# Patient Record
Sex: Female | Born: 2000 | Race: White | Hispanic: No | Marital: Single | Smoking: Never smoker
Health system: Southern US, Community
[De-identification: ages and names within clinical notes are randomized; demographics above are authoritative.]

## PROBLEM LIST (undated history)

## (undated) DIAGNOSIS — F419 Anxiety disorder, unspecified: Secondary | ICD-10-CM

## (undated) HISTORY — PX: TYMPANOSTOMY TUBE PLACEMENT: SHX32

---
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
## 2022-04-16 ENCOUNTER — Emergency Department (HOSPITAL_BASED_OUTPATIENT_CLINIC_OR_DEPARTMENT_OTHER)
Admission: EM | Admit: 2022-04-16 | Discharge: 2022-04-16 | Disposition: A | Discharge status: Home / Self Care | Payer: 59 | Attending: Emergency Medicine | Admitting: Emergency Medicine

## 2022-04-16 ENCOUNTER — Other Ambulatory Visit: Payer: Self-pay

## 2022-04-16 ENCOUNTER — Encounter (HOSPITAL_BASED_OUTPATIENT_CLINIC_OR_DEPARTMENT_OTHER): Payer: Self-pay | Admitting: Emergency Medicine

## 2022-04-16 DIAGNOSIS — Z20822 Contact with and (suspected) exposure to covid-19: Secondary | ICD-10-CM | POA: Diagnosis not present

## 2022-04-16 DIAGNOSIS — R112 Nausea with vomiting, unspecified: Secondary | ICD-10-CM | POA: Diagnosis not present

## 2022-04-16 DIAGNOSIS — J069 Acute upper respiratory infection, unspecified: Secondary | ICD-10-CM | POA: Diagnosis not present

## 2022-04-16 DIAGNOSIS — R197 Diarrhea, unspecified: Secondary | ICD-10-CM | POA: Diagnosis not present

## 2022-04-16 DIAGNOSIS — R059 Cough, unspecified: Secondary | ICD-10-CM | POA: Diagnosis present

## 2022-04-16 LAB — RESP PANEL BY RT-PCR (RSV, FLU A&B, COVID)  RVPGX2
Influenza A by PCR: NEGATIVE
Influenza B by PCR: NEGATIVE
Resp Syncytial Virus by PCR: NEGATIVE
SARS Coronavirus 2 by RT PCR: NEGATIVE

## 2022-04-16 LAB — PREGNANCY, URINE: Preg Test, Ur: NEGATIVE

## 2022-04-16 MED ORDER — ONDANSETRON 4 MG PO TBDP: 4.0000 mg | ORAL_TABLET | Freq: Three times a day (TID) | ORAL | 0 refills | Status: DC | PRN

## 2022-04-16 MED ORDER — ONDANSETRON 4 MG PO TBDP
8.0000 mg | ORAL_TABLET | Freq: Once | ORAL | Status: AC
  Administered 2022-04-16: 8 mg via ORAL
  Filled 2022-04-16: qty 2

## 2022-04-16 NOTE — ED Provider Notes (Signed)
[facility] EM[facility] DEPT Provider Note   CSN: [id number] Arrival date & time: 04/16/22  1051     History  Chief Complaint  Patient presents with   Generalized Body Aches   Emesis    Lisa Harvey is a 22 y.o. female.  Lisa Harvey is a 22 year old female with no significant PMHx presents to the ED for body aches, chills, vomiting, and diarrhea for 3 days. She states that she checked her temperature yesterday at home and it was 95.37F. She also reports that she has been unable to keep food down or liquids during this time. Patient states that she took Tylenol yesterday but shortly after she vomited. She denies having any exposure to sick contacts. Patient denies the possibility of being pregnant as she is currently on her menstrual cycle. Patient denies chest pain, shortness of breath, dysuria, or abdominal pain.        Home Medications Prior to Admission medications   Medication Sig Start Date End Date Taking? Authorizing Provider  ondansetron (ZOFRAN-ODT) 4 MG disintegrating tablet Take 1 tablet (4 mg total) by mouth every 8 (eight) hours as needed for nausea or vomiting. 04/16/22  Yes Tacy Learn, PA-C  azithromycin (ZITHROMAX) 250 MG tablet Take 2 tabs PO x 1 dose, then 1 tab PO QD x 4 days 12/27/20   Scot Jun, FNP  citalopram (CELEXA) 10 MG tablet Take 1 tablet (10 mg total) by mouth at bedtime. 09/03/15   Saez-Benito, Roxy Manns, MD  diphenhydrAMINE (BENYLIN) 12.5 MG/5ML syrup Take 15 mLs (37.5 mg total) by mouth every 6 (six) hours as needed for itching or allergies. 12/27/16 12/30/16  Cruz, Lia C, DO  famotidine (PEPCID) 20 MG tablet Take 1 tablet (20 mg total) by mouth 2 (two) times daily. Take for 2 more days. Start tomorrow morning 12/28/16 12/27/16 12/29/16  Tenna Child C, DO  guanFACINE (INTUNIV) 1 MG TB24 Take 1 mg by mouth daily with breakfast. 07/29/15   [provider]  guanFACINE (INTUNIV) 1 MG TB24 Take 1 tablet (1 mg total) by  mouth daily with breakfast. 09/03/15   Saez-Benito, Roxy Manns, MD  hydrOXYzine (ATARAX/VISTARIL) 25 MG tablet Take 1 tablet (25 mg total) by mouth at bedtime as needed and may repeat dose one time if needed (insomnia). 09/03/15   Saez-Benito, Roxy Manns, MD      Allergies    Penicillins    Review of Systems   Review of Systems Negative except as per HPI Physical Exam Updated Vital Signs BP (!) 118/56   Pulse 95   Temp 98.2 F (36.8 C) (Temporal)   Resp 16   Ht 5\' 5"  (1.651 m)   Wt 49.9 kg   SpO2 100%   BMI 18.30 kg/m  Physical Exam Vitals and nursing note reviewed.  Constitutional:      General: She is not in acute distress.    Appearance: She is well-developed. She is not diaphoretic.  HENT:     Head: Normocephalic and atraumatic.     Right Ear: Tympanic membrane normal.     Left Ear: Tympanic membrane and ear canal normal.     Nose: Congestion present.     Mouth/Throat:     Mouth: Mucous membranes are moist.     Pharynx: No oropharyngeal exudate or posterior oropharyngeal erythema.  Eyes:     Conjunctiva/sclera: Conjunctivae normal.  Cardiovascular:     Rate and Rhythm: Normal rate and regular rhythm.  Heart sounds: Normal heart sounds.  Pulmonary:     Effort: Pulmonary effort is normal.     Breath sounds: Normal breath sounds.  Abdominal:     Palpations: Abdomen is soft.     Tenderness: There is no abdominal tenderness. There is no right CVA tenderness or left CVA tenderness.  Musculoskeletal:     Cervical back: Neck supple.  Lymphadenopathy:     Cervical: No cervical adenopathy.  Skin:    General: Skin is warm and dry.     Findings: No erythema or rash.  Neurological:     Mental Status: She is alert and oriented to person, place, and time.  Psychiatric:        Behavior: Behavior normal.     ED Results / Procedures / Treatments   Labs (all labs ordered are listed, but only abnormal results are displayed) Labs Reviewed  RESP PANEL BY  RT-PCR (RSV, FLU A&B, COVID)  [id number]  PREGNANCY, URINE    EKG None  Radiology No results found.  Procedures Procedures    Medications Ordered in ED Medications  ondansetron (ZOFRAN-ODT) disintegrating tablet 8 mg (8 mg Oral Given 04/16/22 1204)    ED Course/ Medical Decision Making/ A&P                           Medical Decision Making  22 year old female presents with concern for chills, body aches with vomiting and diarrhea onset yesterday.  With URI symptoms.  Lungs are clear to auscultation, mild nasal congestion otherwise mucous membranes are moist.  Abdomen soft and nontender.  Patient was provided with Zofran, nausea has resolved, is tolerating p.o. intake negative for COVID/flu/RSV.  hCG negative. Suspect viral illness, provided with Zofran to help with nausea and vomiting.  Can try Pepto or Imodium if needed for diarrhea.  Return for worsening or concerning symptoms.        Final Clinical Impression(s) / ED Diagnoses Final diagnoses:  Viral URI with cough  Nausea vomiting and diarrhea    Rx / DC Orders ED Discharge Orders          Ordered    ondansetron (ZOFRAN-ODT) 4 MG disintegrating tablet  Every 8 hours PRN        04/16/22 1226              Roque Lias 04/16/22 1235    Hayden Rasmussen, MD 04/16/22 1711

## 2022-04-16 NOTE — Discharge Instructions (Addendum)
Zofran as needed as prescribed for nausea and vomiting.  Pepto or Imodium as needed as directed for diarrhea. Home to rest and hydrate.  Return for worsening or concerning symptoms.

## 2022-04-16 NOTE — ED Triage Notes (Signed)
Pt arrives to ED with c/o body aches, chills, diarrhea, vomiting x3 days.

## 2022-04-16 NOTE — ED Notes (Signed)
Pt given gingerale for PO challenge 

## 2023-02-21 IMAGING — CT CT ABD-PELV W/ CM
2 of 4 series · 16 of 46 positions shown, 18 images · IV contrast (agent unspecified)
Comparison: 10/23/2014

CLINICAL DATA: Abdominal pain, acute, nonlocalized RLQ pain

EXAM:
CT ABDOMEN AND PELVIS WITH CONTRAST
TECHNIQUE: Multidetector CT imaging of the abdomen and pelvis was performed
using the standard protocol following bolus administration of
intravenous contrast.

[Series 2: abd pel w · axial · 0.54mm/px · z∈[+700,+1075]mm · 13 of 83 slices shown, 15 images]
[im 4/83  soft-tissue]
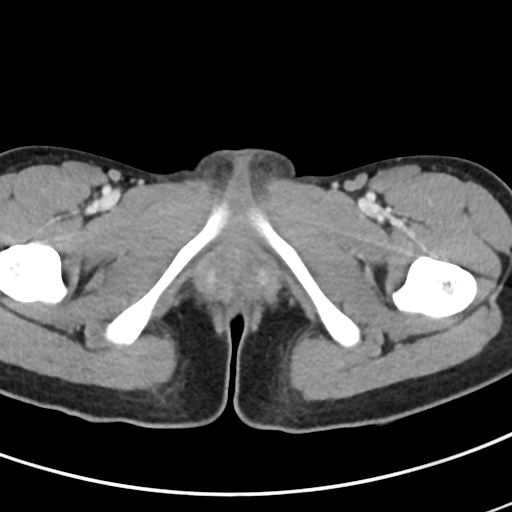
[im 4/83  bone]
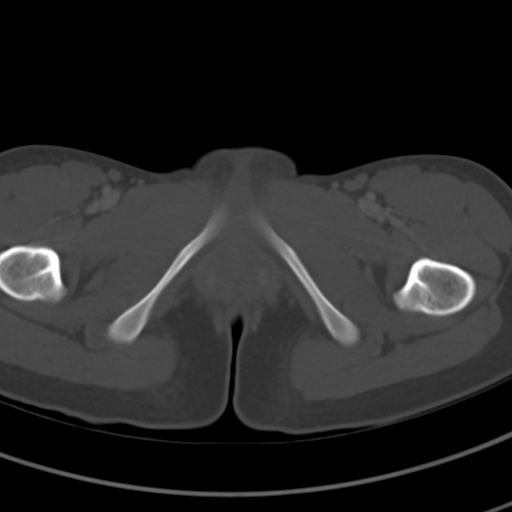
[im 11/83  soft-tissue]
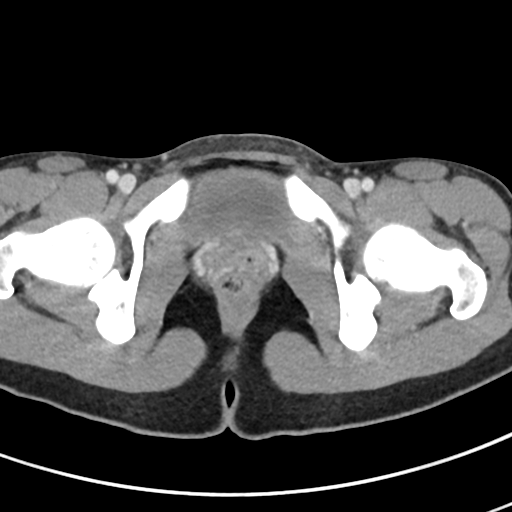
[im 18/83  soft-tissue]
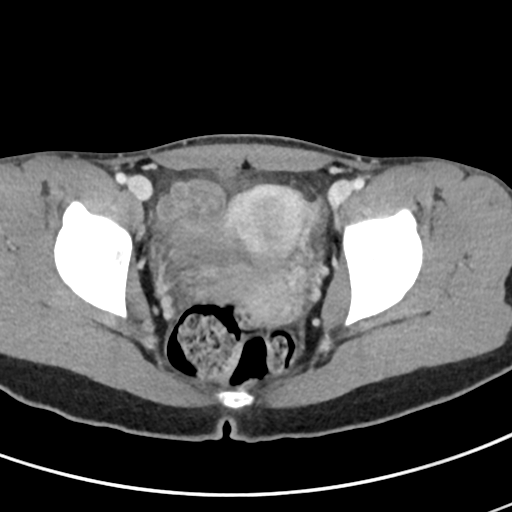
[im 24/83  soft-tissue]
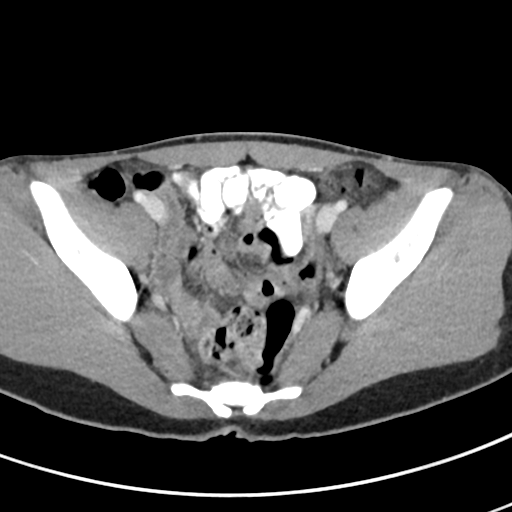
[im 28/83  soft-tissue]
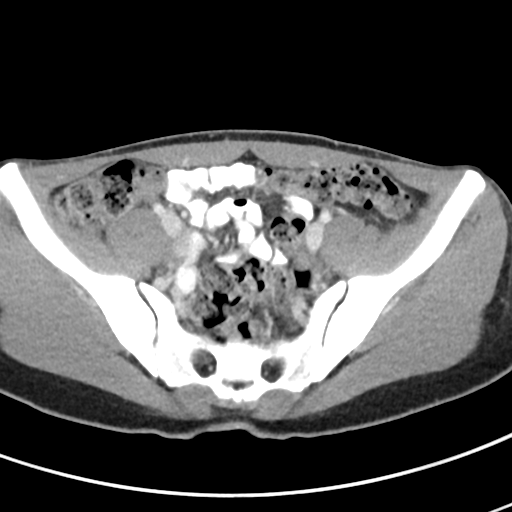
[im 35/83  soft-tissue]
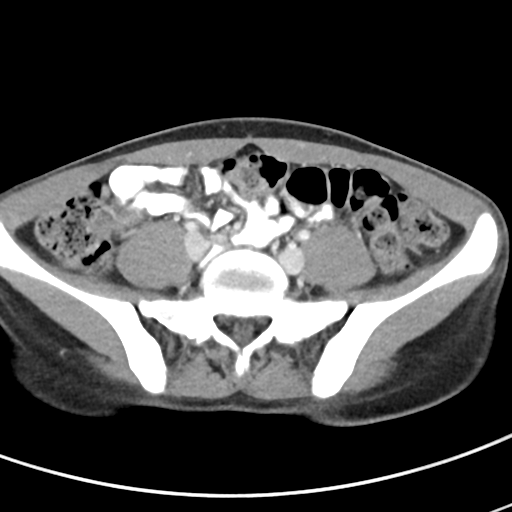
[im 42/83  soft-tissue]
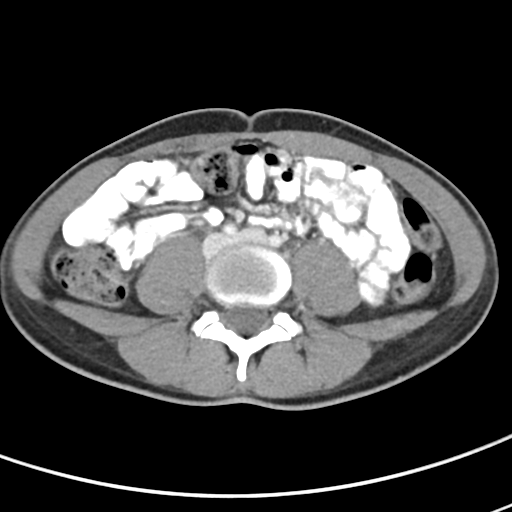
[im 48/83  soft-tissue]
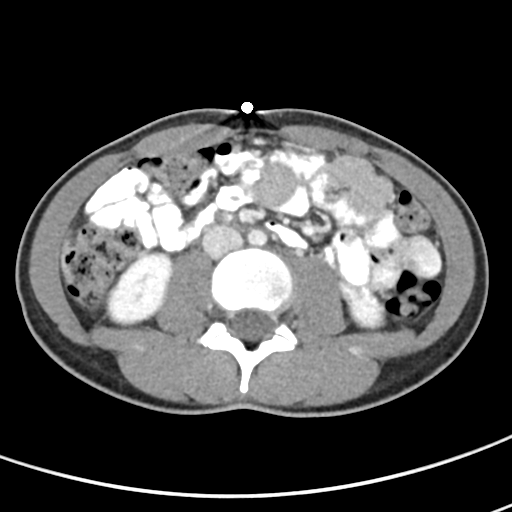
[im 55/83  soft-tissue]
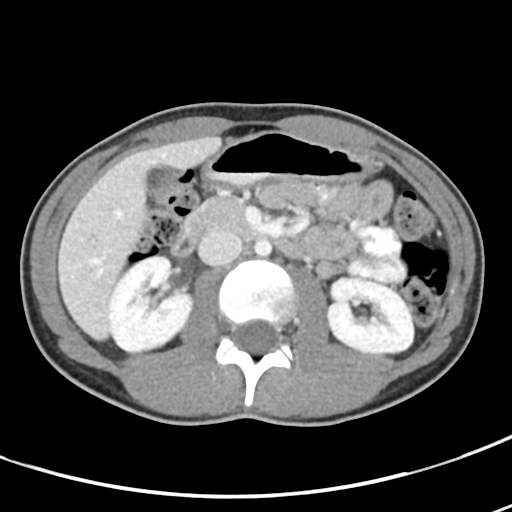
[im 55/83  bone]
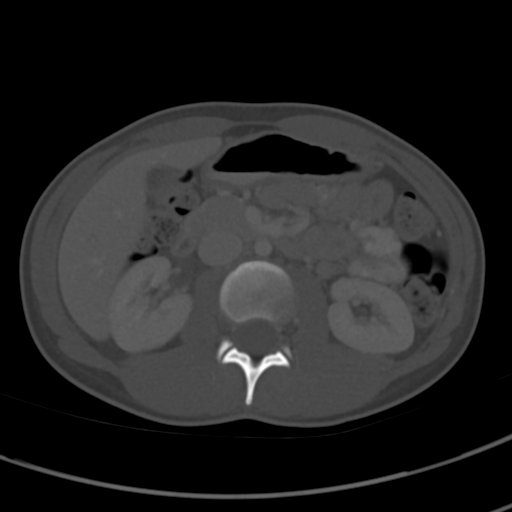
[im 59/83  soft-tissue]
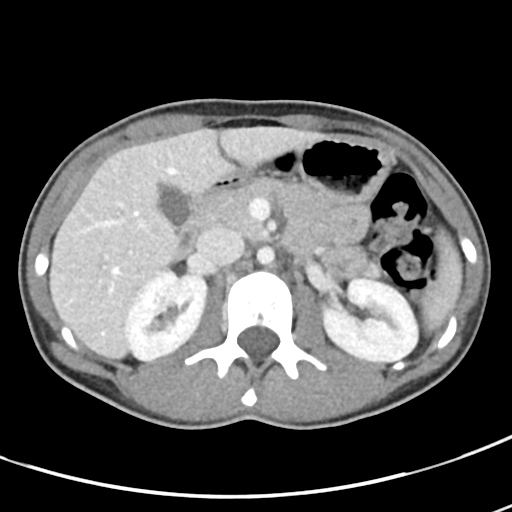
[im 65/83  soft-tissue]
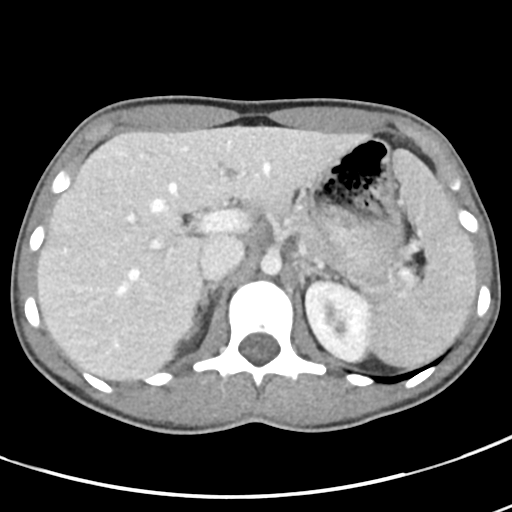
[im 72/83  soft-tissue]
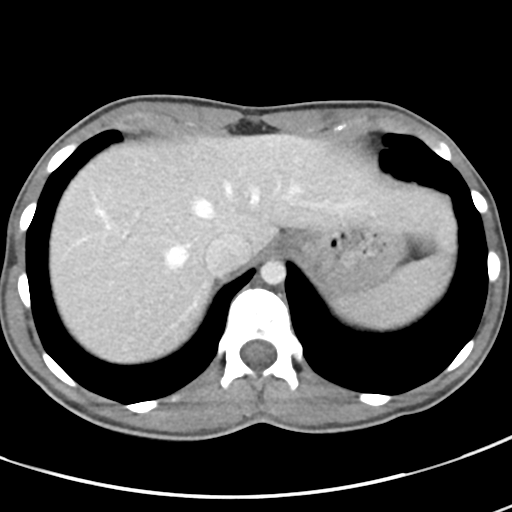
[im 79/83  soft-tissue]
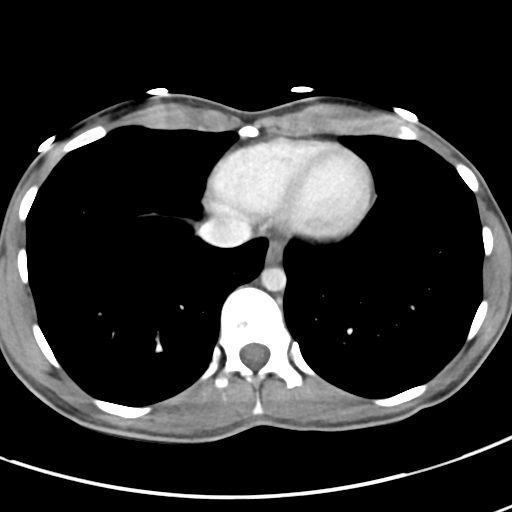

[Series 5: coronal · coronal · 0.60mm/px · 3 of 67 slices shown]
[im 23/67  soft-tissue]
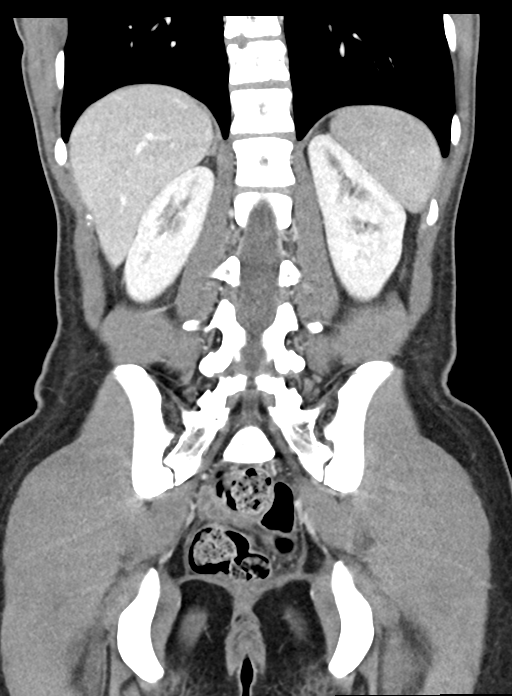
[im 30/67  soft-tissue]
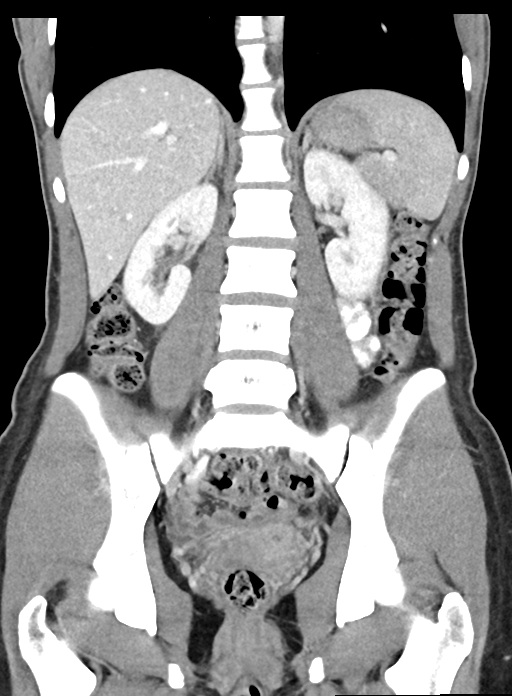
[im 37/67  soft-tissue]
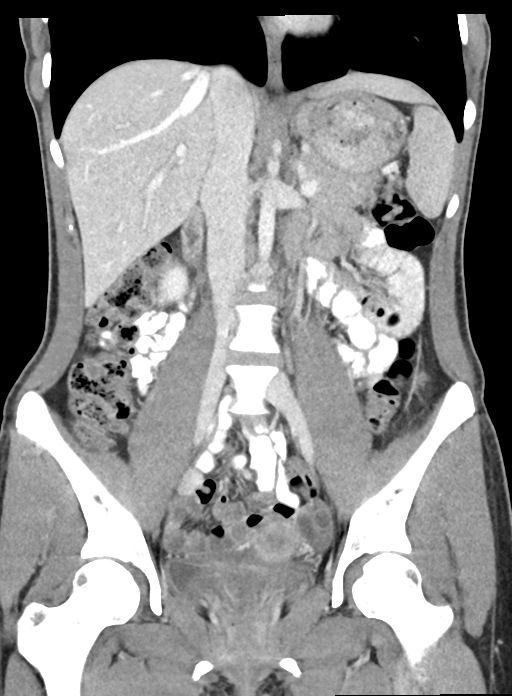

[16 of 46 positions shown; findings below may reference images not displayed]

RADIATION DOSE REDUCTION: This exam was performed according to the
departmental dose-optimization program which includes automated
exposure control, adjustment of the mA and/or kV according to
patient size and/or use of iterative reconstruction technique.

CONTRAST:  100mL OMNIPAQUE IOHEXOL 300 MG/ML  SOLN
FINDINGS: Lower chest: No acute abnormality.

Hepatobiliary: No focal hepatic abnormality. Gallbladder
unremarkable.

Pancreas: No focal abnormality or ductal dilatation.

Spleen: No focal abnormality.  Normal size.

Adrenals/Urinary Tract: No adrenal abnormality. No focal renal
abnormality. No stones or hydronephrosis. Urinary bladder is
partially decompressed. Bladder wall appears mildly thickened.
Question cystitis.

Stomach/Bowel: Moderate stool burden throughout the colon. Stomach,
large and small bowel grossly unremarkable. Appendix is visualized
and is normal.

Vascular/Lymphatic: No evidence of aneurysm or adenopathy.

Reproductive: Uterus and adnexa unremarkable.  No mass.

Other: No free fluid or free air.

Musculoskeletal: No acute bony abnormality.
IMPRESSION: Normal appendix.

Moderate stool burden in the colon.

Bladder wall appears mildly thickened although bladder is partially
decompressed. Recommend clinical correlation to exclude cystitis.

## 2023-11-14 ENCOUNTER — Other Ambulatory Visit: Payer: Self-pay

## 2023-11-14 ENCOUNTER — Emergency Department (HOSPITAL_COMMUNITY)

## 2023-11-14 ENCOUNTER — Observation Stay (HOSPITAL_COMMUNITY)
Admission: EM | Admit: 2023-11-14 | Discharge: 2023-11-15 | Disposition: A | Discharge status: Home / Self Care | Attending: Surgery | Admitting: Surgery

## 2023-11-14 DIAGNOSIS — K3589 Other acute appendicitis without perforation or gangrene: Secondary | ICD-10-CM | POA: Diagnosis not present

## 2023-11-14 DIAGNOSIS — K3580 Unspecified acute appendicitis: Secondary | ICD-10-CM | POA: Diagnosis present

## 2023-11-14 LAB — COMPREHENSIVE METABOLIC PANEL WITH GFR
ALT: 16 U/L (ref 0–44)
AST: 23 U/L (ref 15–41)
Albumin: 4.6 g/dL (ref 3.5–5.0)
Alkaline Phosphatase: 63 U/L (ref 38–126)
Anion gap: 13 (ref 5–15)
BUN: 6 mg/dL (ref 6–20)
CO2: 19 mmol/L — ABNORMAL LOW (ref 22–32)
Calcium: 9.8 mg/dL (ref 8.9–10.3)
Chloride: 106 mmol/L (ref 98–111)
Creatinine, Ser: 0.83 mg/dL (ref 0.44–1.00)
GFR, Estimated: >60 mL/min (ref >60)
Glucose, Bld: 103 mg/dL — ABNORMAL HIGH (ref 70–99)
Potassium: 3.8 mmol/L (ref 3.5–5.1)
Sodium: 138 mmol/L (ref 135–145)
Total Bilirubin: 1 mg/dL (ref 0.0–1.2)
Total Protein: 7.4 g/dL (ref 6.5–8.1)

## 2023-11-14 LAB — CBC
HCT: 47.6 % — ABNORMAL HIGH (ref 36.0–46.0)
Hemoglobin: 16 g/dL — ABNORMAL HIGH (ref 12.0–15.0)
MCH: 30.5 pg (ref 26.0–34.0)
MCHC: 33.6 g/dL (ref 30.0–36.0)
MCV: 90.8 fL (ref 80.0–100.0)
Platelets: 365 K/uL (ref 150–400)
RBC: 5.24 MIL/uL — ABNORMAL HIGH (ref 3.87–5.11)
RDW: 13.2 % (ref 11.5–15.5)
WBC: 16.5 K/uL — ABNORMAL HIGH (ref 4.0–10.5)
nRBC: 0 % (ref 0.0–0.2)

## 2023-11-14 LAB — URINALYSIS, ROUTINE W REFLEX MICROSCOPIC
Bilirubin Urine: NEGATIVE
Glucose, UA: NEGATIVE mg/dL
Hgb urine dipstick: NEGATIVE
Ketones, ur: NEGATIVE mg/dL
Leukocytes,Ua: NEGATIVE
Nitrite: NEGATIVE
Protein, ur: NEGATIVE mg/dL
Specific Gravity, Urine: 1.003 — ABNORMAL LOW (ref 1.005–1.030)
pH: 7 (ref 5.0–8.0)

## 2023-11-14 LAB — LIPASE, BLOOD: Lipase: 31 U/L (ref 11–51)

## 2023-11-14 LAB — POC URINE PREG, ED: Preg Test, Ur: NEGATIVE

## 2023-11-14 LAB — HCG, SERUM, QUALITATIVE: Preg, Serum: NEGATIVE

## 2023-11-14 MED ORDER — IOHEXOL 350 MG/ML SOLN
75.0000 mL | Freq: Once | INTRAVENOUS | Status: AC | PRN
  Administered 2023-11-14 (×2): 75 mL via INTRAVENOUS

## 2023-11-14 MED ORDER — SODIUM CHLORIDE 0.9 % IV BOLUS
1000.0000 mL | Freq: Once | INTRAVENOUS | Status: AC
  Administered 2023-11-14 (×2): 1000 mL via INTRAVENOUS

## 2023-11-14 MED ORDER — MORPHINE SULFATE (PF) 2 MG/ML IV SOLN
4.0000 mg | Freq: Once | INTRAVENOUS | Status: AC
  Administered 2023-11-14 (×2): 4 mg via INTRAVENOUS
  Filled 2023-11-14: qty 2

## 2023-11-14 MED ORDER — ONDANSETRON HCL 4 MG/2ML IJ SOLN
4.0000 mg | Freq: Once | INTRAMUSCULAR | Status: AC
  Administered 2023-11-14 (×2): 4 mg via INTRAVENOUS
  Filled 2023-11-14: qty 2

## 2023-11-14 NOTE — ED Triage Notes (Signed)
 Pt began having RUQ pain an hour ago. Pain is sharp and comes and goes. Having nausea. No vomiting or diarrhea. Denies urinary symptoms.  Denies fevers

## 2023-11-14 NOTE — ED Notes (Signed)
 Patient returned from Ultrasound.

## 2023-11-14 NOTE — ED Provider Notes (Signed)
 [facility] Provider Note   CSN: [id number] Arrival date & time: 11/14/23  1956     Patient presents with: Abdominal Pain   Lisa Harvey is a 23 y.o. female presents today for right lower quadrant abdominal pain.  Patient is sharp and intermittent.  Patient also reports associated nausea.  Patient denies vomiting, diarrhea, dysuria, hematuria, frequency, urgency, fever, chills, or vaginal discharge.  Patient reports that she does not always use contraceptive barrier, however she does have a single sexual partner.    Abdominal Pain Associated symptoms: nausea        Prior to Admission medications   Medication Sig Start Date End Date Taking? Authorizing Provider  azithromycin  (ZITHROMAX ) 250 MG tablet Take 2 tabs PO x 1 dose, then 1 tab PO QD x 4 days 12/27/20   Arloa Suzen RAMAN, NP  citalopram  (CELEXA ) 10 MG tablet Take 1 tablet (10 mg total) by mouth at bedtime. 09/03/15   Saez-Benito, Eva CHRISTELLA Rav, MD  diphenhydrAMINE  (BENYLIN ) 12.5 MG/5ML syrup Take 15 mLs (37.5 mg total) by mouth every 6 (six) hours as needed for itching or allergies. 12/27/16 12/30/16  Cruz, Lia C, DO  famotidine  (PEPCID ) 20 MG tablet Take 1 tablet (20 mg total) by mouth 2 (two) times daily. Take for 2 more days. Start tomorrow morning 12/28/16 12/27/16 12/29/16  Cruz, Lia C, DO  guanFACINE  (INTUNIV ) 1 MG TB24 Take 1 mg by mouth daily with breakfast. 07/29/15   [provider]  guanFACINE  (INTUNIV ) 1 MG TB24 Take 1 tablet (1 mg total) by mouth daily with breakfast. 09/03/15   Saez-Benito, Eva CHRISTELLA Rav, MD  hydrOXYzine  (ATARAX /VISTARIL ) 25 MG tablet Take 1 tablet (25 mg total) by mouth at bedtime as needed and may repeat dose one time if needed (insomnia). 09/03/15   Saez-Benito, Eva CHRISTELLA Rav, MD  ondansetron  (ZOFRAN -ODT) 4 MG disintegrating tablet Take 1 tablet (4 mg total) by mouth every 8 (eight) hours as needed for nausea or vomiting. 04/16/22   Beverley Leita LABOR, PA-C    Allergies: Penicillins    Review of Systems  Gastrointestinal:  Positive for abdominal pain and nausea.    Updated Vital Signs BP (!) 99/54   Pulse (!) 104   Temp 98.9 F (37.2 C) (Oral)   Resp 20   Ht 5' 4 (1.626 m)   Wt 49.9 kg   LMP 11/11/2023   SpO2 100%   BMI 18.88 kg/m   Physical Exam Vitals and nursing note reviewed.  Constitutional:      General: She is not in acute distress.    Appearance: She is well-developed.  HENT:     Head: Normocephalic and atraumatic.     Mouth/Throat:     Mouth: Mucous membranes are moist.     Pharynx: Oropharynx is clear.  Eyes:     Extraocular Movements: Extraocular movements intact.     Conjunctiva/sclera: Conjunctivae normal.  Cardiovascular:     Rate and Rhythm: Normal rate and regular rhythm.     Heart sounds: Normal heart sounds. No murmur heard. Pulmonary:     Effort: Pulmonary effort is normal. No respiratory distress.     Breath sounds: Normal breath sounds.  Abdominal:     General: There is no distension.     Palpations: Abdomen is soft.     Tenderness: There is abdominal tenderness in the right lower quadrant. There is guarding and rebound. Positive signs include McBurney's sign. Negative signs include Murphy's sign and  Rovsing's sign.  Musculoskeletal:        General: No swelling.     Cervical back: Neck supple.  Skin:    General: Skin is warm and dry.     Capillary Refill: Capillary refill takes less than 2 seconds.  Neurological:     General: No focal deficit present.     Mental Status: She is alert.  Psychiatric:        Mood and Affect: Mood normal.     (all labs ordered are listed, but only abnormal results are displayed) Labs Reviewed  COMPREHENSIVE METABOLIC PANEL WITH GFR - Abnormal; Notable for the following components:      Result Value   CO2 19 (*)    Glucose, Bld 103 (*)    All other components within normal limits  CBC - Abnormal; Notable for the following components:   WBC  16.5 (*)    RBC 5.24 (*)    Hemoglobin 16.0 (*)    HCT 47.6 (*)    All other components within normal limits  URINALYSIS, ROUTINE W REFLEX MICROSCOPIC - Abnormal; Notable for the following components:   Color, Urine COLORLESS (*)    Specific Gravity, Urine 1.003 (*)    All other components within normal limits  LIPASE, BLOOD  HCG, SERUM, QUALITATIVE  LACTIC ACID, PLASMA  LACTIC ACID, PLASMA  POC URINE PREG, ED    EKG: None  Radiology: US  PELVIC COMPLETE W TRANSVAGINAL AND TORSION R/O Result Date: 11/14/2023 CLINICAL DATA:  Right lower quadrant pain EXAM: TRANSABDOMINAL AND TRANSVAGINAL ULTRASOUND OF PELVIS TECHNIQUE: Both transabdominal and transvaginal ultrasound examinations of the pelvis were performed. Transabdominal technique was performed for global imaging of the pelvis including uterus, ovaries, adnexal regions, and pelvic cul-de-sac. It was necessary to proceed with endovaginal exam following the transabdominal exam to visualize the ovaries and endometrium. COMPARISON:  CT abdomen and pelvis 08/09/2021 FINDINGS: Uterus Measurements: 7.2 x 3.0 x 3.8 cm = volume: 43 mL. No fibroids or other mass visualized. Endometrium Thickness: 3.1 mm.  No focal abnormality visualized. Right ovary Not visualized. Left ovary Measurements: 2.9 x 2.1 x 2.6 cm = volume: 8.4 mL. There is a 2.2 x 1.6 x 2.1 cm simple cyst in the left ovary. Other findings No abnormal free fluid. IMPRESSION: 1. 2.2 cm simple cyst in the left ovary. No follow-up imaging recommended. 2. Right ovary is not visualized. 3. No other significant abnormality. Electronically Signed   By: Greig Pique M.D.   On: 11/14/2023 22:54     Procedures   Medications Ordered in the ED  morphine  (PF) 2 MG/ML injection 4 mg (4 mg Intravenous Given 11/14/23 2248)  ondansetron  (ZOFRAN ) injection 4 mg (4 mg Intravenous Given 11/14/23 2248)  sodium chloride  0.9 % bolus 1,000 mL (1,000 mLs Intravenous New Bag/Given 11/14/23 2250)  iohexol   (OMNIPAQUE ) 350 MG/ML injection 75 mL (75 mLs Intravenous Contrast Given 11/14/23 2305)                                    Medical Decision Making Amount and/or Complexity of Data Reviewed Labs: ordered.   This patient presents to the ED for concern of right lower quadrant pain differential diagnosis includes pancreatitis, appendicitis, choledocholithiasis, acute cholecystitis, diverticulitis, small bowel obstruction, ovarian torsion, TOA, ectopic pregnancy  Lab Tests:  I Ordered, and personally interpreted labs.  The pertinent results include: UA with low specific gravity, negative pregnancy, leukocytosis 16.5 and  elevated hemoglobin at 16 possible hemoconcentration, CMP with mildly reduced CO2 at 19   Imaging Studies ordered:  I ordered imaging studies including ultrasound torsion rule out I independently visualized and interpreted imaging which showed right ovary not visualized.  2.2 cm simple cyst in the left ovary, no follow-up imaging required. I agree with the radiologist interpretation CT abdomen pelvis with contrast which showed   Medicines ordered and prescription drug management:  I ordered medication including IVF, Zofran , morphine     I have reviewed the patients home medicines and have made adjustments as needed  11:56 PM Care of Cesiah Westley transferred to [facility] Olam Sloc[facility] and Dr. Carita at the end of my shift as the patient will require reassessment once labs/imaging have resulted. Patient presentation, ED course, and plan of care discussed with review of all pertinent labs and imaging. Please see his/her note for further details regarding further ED course and disposition. Plan at time of handoff is CT imaging which will determine dispo. This may be altered or completely changed at the discretion of the oncoming team pending results of further workup.      Final diagnoses:  None    ED Discharge Orders     None          Francis Ileana LOISE DEVONNA 11/14/23  2356    Cleotilde Rogue, MD 11/15/23 [phone]

## 2023-11-14 NOTE — ED Provider Triage Note (Signed)
 Emergency Medicine Provider Triage Evaluation Note  Lisa Harvey , a 23 y.o. female  was evaluated in triage.  Pt complains of sudden RLQ pain starting 2 hours pta. She reports that she was standing cooking when sudden pain. Having some nausea but no vomiting. Normal BM.  Review of Systems  Positive:  Negative:   Physical Exam  BP 139/89   Pulse (!) 120   Temp 98.7 F (37.1 C) (Oral)   Ht 5' 4 (1.626 m)   Wt 49.9 kg   LMP 11/11/2023   SpO2 100%   BMI 18.88 kg/m  Gen:   Awake, no distress   Resp:  Normal effort  MSK:   Moves extremities without difficulty  Other:  Tenderness to the RLQ. No guarding or rebound.   Medical Decision Making  Medically screening exam initiated at 8:27 PM.  Appropriate orders placed.  Donnalyn Juran was informed that the remainder of the evaluation will be completed by another provider, this initial triage assessment does not replace that evaluation, and the importance of remaining in the ED until their evaluation is complete.  POC preg is neg. Called US  for STAT pelvic doppler for concern for torsion. They verbalized understanding of request. If normal, will need CT scan.    Bernis Ernst, PA-C 11/14/23 2030

## 2023-11-15 ENCOUNTER — Observation Stay (HOSPITAL_COMMUNITY): Admitting: Anesthesiology

## 2023-11-15 ENCOUNTER — Encounter (HOSPITAL_COMMUNITY): Payer: Self-pay

## 2023-11-15 ENCOUNTER — Other Ambulatory Visit: Payer: Self-pay

## 2023-11-15 ENCOUNTER — Encounter (HOSPITAL_COMMUNITY)
Admission: EM | Disposition: A | Discharge status: Home / Self Care | Payer: Self-pay | Source: Home / Self Care | Attending: Emergency Medicine

## 2023-11-15 ENCOUNTER — Other Ambulatory Visit (HOSPITAL_COMMUNITY): Payer: Self-pay

## 2023-11-15 DIAGNOSIS — K3580 Unspecified acute appendicitis: Secondary | ICD-10-CM | POA: Diagnosis present

## 2023-11-15 DIAGNOSIS — K388 Other specified diseases of appendix: Secondary | ICD-10-CM | POA: Diagnosis not present

## 2023-11-15 HISTORY — PX: LAPAROSCOPIC APPENDECTOMY: SHX408

## 2023-11-15 LAB — HIV ANTIBODY (ROUTINE TESTING W REFLEX): HIV Screen 4th Generation wRfx: NONREACTIVE

## 2023-11-15 LAB — LACTIC ACID, PLASMA: Lactic Acid, Venous: 1.1 mmol/L (ref 0.5–1.9)

## 2023-11-15 SURGERY — APPENDECTOMY, LAPAROSCOPIC
Anesthesia: General | Site: Abdomen

## 2023-11-15 MED ORDER — ACETAMINOPHEN 500 MG PO TABS: 1000.0000 mg | ORAL_TABLET | Freq: Once | ORAL | Status: DC

## 2023-11-15 MED ORDER — SUGAMMADEX SODIUM 200 MG/2ML IV SOLN
INTRAVENOUS | Status: DC | PRN
  Administered 2023-11-15 (×2): 200 mg via INTRAVENOUS

## 2023-11-15 MED ORDER — OXYCODONE HCL 5 MG/5ML PO SOLN: 5.0000 mg | Freq: Once | ORAL | Status: DC | PRN

## 2023-11-15 MED ORDER — MEPERIDINE HCL 25 MG/ML IJ SOLN: 6.2500 mg | INTRAMUSCULAR | Status: DC | PRN

## 2023-11-15 MED ORDER — DEXMEDETOMIDINE HCL IN NACL 80 MCG/20ML IV SOLN
INTRAVENOUS | Status: DC | PRN
  Administered 2023-11-15: 4 ug via INTRAVENOUS
  Administered 2023-11-15: 8 ug via INTRAVENOUS
  Administered 2023-11-15: 4 ug via INTRAVENOUS
  Administered 2023-11-15: 8 ug via INTRAVENOUS
  Administered 2023-11-15 (×2): 4 ug via INTRAVENOUS

## 2023-11-15 MED ORDER — SIMETHICONE 80 MG PO CHEW
40.0000 mg | CHEWABLE_TABLET | Freq: Four times a day (QID) | ORAL | Status: DC | PRN
Start: 2023-11-15 — End: 2023-11-15

## 2023-11-15 MED ORDER — KETOROLAC TROMETHAMINE 15 MG/ML IJ SOLN
INTRAMUSCULAR | Status: AC
  Filled 2023-11-15: qty 1

## 2023-11-15 MED ORDER — HYDROMORPHONE HCL 1 MG/ML IJ SOLN
0.2500 mg | INTRAMUSCULAR | Status: DC | PRN
  Administered 2023-11-15 (×4): 0.5 mg via INTRAVENOUS

## 2023-11-15 MED ORDER — KETOROLAC TROMETHAMINE 30 MG/ML IJ SOLN
INTRAMUSCULAR | Status: AC
  Filled 2023-11-15: qty 1

## 2023-11-15 MED ORDER — MIDAZOLAM HCL 2 MG/2ML IJ SOLN
INTRAMUSCULAR | Status: DC | PRN
  Administered 2023-11-15 (×4): 1 mg via INTRAVENOUS

## 2023-11-15 MED ORDER — ORAL CARE MOUTH RINSE
15.0000 mL | Freq: Once | OROMUCOSAL | Status: AC
  Administered 2023-11-15 (×2): 15 mL via OROMUCOSAL

## 2023-11-15 MED ORDER — BUPIVACAINE-EPINEPHRINE (PF) 0.25% -1:200000 IJ SOLN
INTRAMUSCULAR | Status: AC
Start: 2023-11-15 — End: 2023-11-15
  Filled 2023-11-15: qty 30

## 2023-11-15 MED ORDER — ONDANSETRON HCL 4 MG/2ML IJ SOLN: 4.0000 mg | Freq: Once | INTRAMUSCULAR | Status: DC | PRN

## 2023-11-15 MED ORDER — METRONIDAZOLE 500 MG/100ML IV SOLN: 500.0000 mg | Freq: Two times a day (BID) | INTRAVENOUS | Status: DC

## 2023-11-15 MED ORDER — DIPHENHYDRAMINE HCL 12.5 MG/5ML PO ELIX
12.5000 mg | ORAL_SOLUTION | Freq: Four times a day (QID) | ORAL | Status: DC | PRN
Start: 2023-11-15 — End: 2023-11-15

## 2023-11-15 MED ORDER — ONDANSETRON HCL 4 MG/2ML IJ SOLN
INTRAMUSCULAR | Status: DC | PRN
  Administered 2023-11-15 (×2): 4 mg via INTRAVENOUS

## 2023-11-15 MED ORDER — DEXAMETHASONE SODIUM PHOSPHATE 10 MG/ML IJ SOLN
INTRAMUSCULAR | Status: AC
  Filled 2023-11-15: qty 1

## 2023-11-15 MED ORDER — ROCURONIUM BROMIDE 10 MG/ML (PF) SYRINGE
PREFILLED_SYRINGE | INTRAVENOUS | Status: DC | PRN
  Administered 2023-11-15 (×2): 50 mg via INTRAVENOUS

## 2023-11-15 MED ORDER — ENOXAPARIN SODIUM 30 MG/0.3ML IJ SOSY: 30.0000 mg | PREFILLED_SYRINGE | INTRAMUSCULAR | Status: DC

## 2023-11-15 MED ORDER — KETOROLAC TROMETHAMINE 30 MG/ML IJ SOLN
15.0000 mg | Freq: Once | INTRAMUSCULAR | Status: AC | PRN
  Administered 2023-11-15 (×2): 15 mg via INTRAVENOUS

## 2023-11-15 MED ORDER — CHLORHEXIDINE GLUCONATE 0.12 % MT SOLN: 15.0000 mL | Freq: Once | OROMUCOSAL | Status: AC

## 2023-11-15 MED ORDER — PANTOPRAZOLE SODIUM 40 MG IV SOLR: 40.0000 mg | Freq: Every day | INTRAVENOUS | Status: DC

## 2023-11-15 MED ORDER — ACETAMINOPHEN 10 MG/ML IV SOLN
INTRAVENOUS | Status: AC
  Filled 2023-11-15: qty 100

## 2023-11-15 MED ORDER — AMISULPRIDE (ANTIEMETIC) 5 MG/2ML IV SOLN: 10.0000 mg | Freq: Once | INTRAVENOUS | Status: DC | PRN

## 2023-11-15 MED ORDER — MORPHINE SULFATE (PF) 2 MG/ML IV SOLN
1.0000 mg | INTRAVENOUS | Status: DC | PRN
  Administered 2023-11-15 (×2): 2 mg via INTRAVENOUS
  Filled 2023-11-15: qty 1

## 2023-11-15 MED ORDER — PROPOFOL 10 MG/ML IV BOLUS
INTRAVENOUS | Status: DC | PRN
  Administered 2023-11-15 (×2): 100 mg via INTRAVENOUS

## 2023-11-15 MED ORDER — FENTANYL CITRATE (PF) 250 MCG/5ML IJ SOLN
INTRAMUSCULAR | Status: DC | PRN
  Administered 2023-11-15 (×6): 50 ug via INTRAVENOUS

## 2023-11-15 MED ORDER — DIPHENHYDRAMINE HCL 50 MG/ML IJ SOLN
12.5000 mg | Freq: Four times a day (QID) | INTRAMUSCULAR | Status: DC | PRN
Start: 2023-11-15 — End: 2023-11-15

## 2023-11-15 MED ORDER — BUPIVACAINE-EPINEPHRINE 0.25% -1:200000 IJ SOLN
INTRAMUSCULAR | Status: DC | PRN
  Administered 2023-11-15 (×2): 30 mL

## 2023-11-15 MED ORDER — LIDOCAINE 2% (20 MG/ML) 5 ML SYRINGE
INTRAMUSCULAR | Status: AC
Start: 2023-11-15 — End: 2023-11-15
  Filled 2023-11-15: qty 5

## 2023-11-15 MED ORDER — PROPOFOL 10 MG/ML IV BOLUS
INTRAVENOUS | Status: AC
Start: 2023-11-15 — End: 2023-11-15
  Filled 2023-11-15: qty 20

## 2023-11-15 MED ORDER — OXYCODONE HCL 5 MG PO TABS
5.0000 mg | ORAL_TABLET | Freq: Four times a day (QID) | ORAL | 0 refills | Status: AC | PRN
  Filled 2023-11-15: qty 15, 4d supply, fill #0

## 2023-11-15 MED ORDER — OXYCODONE HCL 5 MG PO TABS: 5.0000 mg | ORAL_TABLET | ORAL | Status: DC | PRN

## 2023-11-15 MED ORDER — MORPHINE SULFATE (PF) 4 MG/ML IV SOLN
4.0000 mg | Freq: Once | INTRAVENOUS | Status: AC
  Administered 2023-11-15 (×2): 4 mg via INTRAVENOUS
  Filled 2023-11-15: qty 1

## 2023-11-15 MED ORDER — CHLORHEXIDINE GLUCONATE 0.12 % MT SOLN
OROMUCOSAL | Status: AC
  Filled 2023-11-15: qty 15

## 2023-11-15 MED ORDER — PHENYLEPHRINE 80 MCG/ML (10ML) SYRINGE FOR IV PUSH (FOR BLOOD PRESSURE SUPPORT)
PREFILLED_SYRINGE | INTRAVENOUS | Status: DC | PRN
  Administered 2023-11-15 (×2): 80 ug via INTRAVENOUS

## 2023-11-15 MED ORDER — DEXAMETHASONE SODIUM PHOSPHATE 10 MG/ML IJ SOLN
INTRAMUSCULAR | Status: DC | PRN
  Administered 2023-11-15 (×2): 10 mg via INTRAVENOUS

## 2023-11-15 MED ORDER — 0.9 % SODIUM CHLORIDE (POUR BTL) OPTIME
TOPICAL | Status: DC | PRN
  Administered 2023-11-15 (×2): 1000 mL

## 2023-11-15 MED ORDER — ONDANSETRON HCL 4 MG/2ML IJ SOLN
INTRAMUSCULAR | Status: AC
Start: 2023-11-15 — End: 2023-11-15
  Filled 2023-11-15: qty 2

## 2023-11-15 MED ORDER — ONDANSETRON 4 MG PO TBDP: 4.0000 mg | ORAL_TABLET | Freq: Four times a day (QID) | ORAL | Status: DC | PRN

## 2023-11-15 MED ORDER — SODIUM CHLORIDE 0.9 % IV SOLN
2.0000 g | Freq: Once | INTRAVENOUS | Status: AC
  Administered 2023-11-15 (×2): 2 g via INTRAVENOUS
  Filled 2023-11-15: qty 20

## 2023-11-15 MED ORDER — ACETAMINOPHEN 500 MG PO TABS
1000.0000 mg | ORAL_TABLET | Freq: Four times a day (QID) | ORAL | Status: DC
  Administered 2023-11-15 (×2): 1000 mg via ORAL
  Filled 2023-11-15: qty 2

## 2023-11-15 MED ORDER — KCL IN DEXTROSE-NACL 20-5-0.45 MEQ/L-%-% IV SOLN
INTRAVENOUS | Status: DC
  Filled 2023-11-15: qty 1000

## 2023-11-15 MED ORDER — HYDROMORPHONE HCL 1 MG/ML IJ SOLN
INTRAMUSCULAR | Status: AC
  Filled 2023-11-15: qty 1

## 2023-11-15 MED ORDER — ROCURONIUM BROMIDE 10 MG/ML (PF) SYRINGE
PREFILLED_SYRINGE | INTRAVENOUS | Status: AC
Start: 2023-11-15 — End: 2023-11-15
  Filled 2023-11-15: qty 10

## 2023-11-15 MED ORDER — MELATONIN 3 MG PO TABS: 3.0000 mg | ORAL_TABLET | Freq: Every evening | ORAL | Status: DC | PRN

## 2023-11-15 MED ORDER — LACTATED RINGERS IV SOLN: INTRAVENOUS | Status: DC

## 2023-11-15 MED ORDER — IBUPROFEN 800 MG PO TABS
800.0000 mg | ORAL_TABLET | Freq: Three times a day (TID) | ORAL | 0 refills | Status: AC | PRN
  Filled 2023-11-15: qty 30, 10d supply, fill #0

## 2023-11-15 MED ORDER — MIDAZOLAM HCL 2 MG/2ML IJ SOLN
INTRAMUSCULAR | Status: AC
  Filled 2023-11-15: qty 2

## 2023-11-15 MED ORDER — FENTANYL CITRATE (PF) 250 MCG/5ML IJ SOLN
INTRAMUSCULAR | Status: AC
  Filled 2023-11-15: qty 5

## 2023-11-15 MED ORDER — METRONIDAZOLE 500 MG/100ML IV SOLN
500.0000 mg | Freq: Once | INTRAVENOUS | Status: AC
  Administered 2023-11-15 (×2): 500 mg via INTRAVENOUS
  Filled 2023-11-15: qty 100

## 2023-11-15 MED ORDER — ONDANSETRON HCL 4 MG/2ML IJ SOLN
4.0000 mg | Freq: Four times a day (QID) | INTRAMUSCULAR | Status: DC | PRN
  Administered 2023-11-15 (×2): 4 mg via INTRAVENOUS
  Filled 2023-11-15: qty 2

## 2023-11-15 MED ORDER — OXYCODONE HCL 5 MG PO TABS: 5.0000 mg | ORAL_TABLET | Freq: Once | ORAL | Status: DC | PRN

## 2023-11-15 MED ORDER — LIDOCAINE 2% (20 MG/ML) 5 ML SYRINGE
INTRAMUSCULAR | Status: DC | PRN
  Administered 2023-11-15 (×2): 50 mg via INTRAVENOUS

## 2023-11-15 MED ORDER — ACETAMINOPHEN 10 MG/ML IV SOLN
INTRAVENOUS | Status: DC | PRN
  Administered 2023-11-15 (×2): 1000 mg via INTRAVENOUS

## 2023-11-15 MED ORDER — SCOPOLAMINE 1 MG/3DAYS TD PT72: 1.0000 | MEDICATED_PATCH | TRANSDERMAL | Status: DC

## 2023-11-15 MED ORDER — SCOPOLAMINE 1 MG/3DAYS TD PT72
MEDICATED_PATCH | TRANSDERMAL | Status: AC
  Administered 2023-11-15 (×2): 1.5 mg via TRANSDERMAL
  Filled 2023-11-15: qty 1

## 2023-11-15 SURGICAL SUPPLY — 32 items
CHLORAPREP W/TINT 26 (MISCELLANEOUS) ×1 IMPLANT
COVER SURGICAL LIGHT HANDLE (MISCELLANEOUS) ×1 IMPLANT
DERMABOND ADVANCED .7 DNX12 (GAUZE/BANDAGES/DRESSINGS) ×1 IMPLANT
ELECTRODE REM PT RTRN 9FT ADLT (ELECTROSURGICAL) ×1 IMPLANT
GLOVE BIO SURGEON STRL SZ7.5 (GLOVE) ×1 IMPLANT
GLOVE BIOGEL PI IND STRL 8 (GLOVE) ×1 IMPLANT
GOWN STRL REUS W/ TWL LRG LVL3 (GOWN DISPOSABLE) ×2 IMPLANT
GOWN STRL REUS W/ TWL XL LVL3 (GOWN DISPOSABLE) ×1 IMPLANT
GRASPER SUT TROCAR 14GX15 (MISCELLANEOUS) ×1 IMPLANT
KIT BASIN OR (CUSTOM PROCEDURE TRAY) ×1 IMPLANT
KIT TURNOVER KIT B (KITS) ×1 IMPLANT
NDL 22X1.5 STRL (OR ONLY) (MISCELLANEOUS) ×1 IMPLANT
NDL INSUFFLATION 14GA 120MM (NEEDLE) ×1 IMPLANT
NEEDLE 22X1.5 STRL (OR ONLY) (MISCELLANEOUS) ×1 IMPLANT
NEEDLE INSUFFLATION 14GA 120MM (NEEDLE) ×1 IMPLANT
NS IRRIG 1000ML POUR BTL (IV SOLUTION) ×1 IMPLANT
PAD ARMBOARD POSITIONER FOAM (MISCELLANEOUS) ×2 IMPLANT
RELOAD STAPLE 60 2.6 WHT THN (STAPLE) ×1 IMPLANT
SET TUBE SMOKE EVAC HIGH FLOW (TUBING) ×1 IMPLANT
SHEARS HARMONIC 36 ACE (MISCELLANEOUS) ×1 IMPLANT
SLEEVE Z-THREAD 5X100MM (TROCAR) ×1 IMPLANT
SPECIMEN JAR SMALL (MISCELLANEOUS) ×1 IMPLANT
STAPLER POWER ECHELON 60 WIDE (STAPLE) ×1 IMPLANT
SUT MNCRL AB 4-0 PS2 18 (SUTURE) ×1 IMPLANT
SUT VICRYL 0 UR6 27IN ABS (SUTURE) IMPLANT
SYSTEM BAG RETRIEVAL 10MM (BASKET) ×1 IMPLANT
TOWEL GREEN STERILE FF (TOWEL DISPOSABLE) ×1 IMPLANT
TRAY LAPAROSCOPIC MC (CUSTOM PROCEDURE TRAY) ×2 IMPLANT
TROCAR Z THREAD OPTICAL 12X100 (TROCAR) ×1 IMPLANT
TROCAR Z-THREAD OPTICAL 5X100M (TROCAR) ×1 IMPLANT
WARMER LAPAROSCOPE (MISCELLANEOUS) ×1 IMPLANT
WATER STERILE IRR 1000ML POUR (IV SOLUTION) ×1 IMPLANT

## 2023-11-15 NOTE — Op Note (Signed)
 Patient: Lisa Harvey (23-Feb-2001, [id number])  Date of Surgery: 11/15/2023  Preoperative Diagnosis: Appendicitis   Postoperative Diagnosis: Appendicitis   Surgical Procedure: APPENDECTOMY, LAPAROSCOPIC   Operative Team Members:  Surgeons and Role:    * Vasiliki Smaldone, Deward PARAS, MD - Primary   Anesthesiologist: Merla Almarie HERO, DO; Darlyn Rush, MD CRNA: Atanacio Arland HERO, CRNA; Emmitt Millman, CRNA   Anesthesia: General   Fluids:  No intake/output data recorded.  Complications: * No complications entered in OR log *  Drains:  none   Specimen: * No specimens in log *   Disposition:  PACU - hemodynamically stable.  Plan of Care: Discharge to home after PACU    Indications for Procedure: Lisa Harvey is a 23 y.o. female who presented with abdominal pain.  History, physical and imaging was concerning for appendicitis, so laparoscopic appendectomy was recommended for the patient.  The procedure itself, as well as the risks, benefits and alternatives were discussed with the patient.  Risks discussed included but were not limited to the risk of bleeding, infection, damage to nearby structures, need to convert to open procedure, incisional hernia, and the need for additional procedures or surgeries.  With this discussion complete and all questions answered the patient granted consent to proceed.  Findings: inflamed appendix  Infection status: Patient: Lisa Harvey Emergency General Surgery Service Patient Case: Urgent Infection Present At Time Of Surgery (PATOS): Inflamed appendix   Description of Procedure:   On the date stated above, the patient was taken to the operating room suite and placed in supine positioning with the left arm tucked.  Sequential compression devices were placed on the lower extremities to prevent blood clots.  General endotracheal anesthesia was induced.  The patient urinated just prior to surgery so a foley catheter was not placed.   Preoperative antibiotics were given.  The patient's abdomen was prepped and draped in the usual sterile fashion.  A time-out was completed verifying the correct patient, procedure, positioning and equipment needed for the case.  We began by anesthetizing the skin with local anesthetic and then making a 5 mm incision just below the umbilicus.  We dissected through the subcutaneous tissues to the fascia.  The fascia was grasped and elevated using a Kocher clamp.  A Veress needle was inserted into the abdomen and the abdomen was insufflated to 15 mmHg.  A 5 mm trocar was inserted in this position under optical guidance and then the abdomen was inspected.  There was no trauma to the underlying viscera with initial trocar placement.  Any abnormal findings, other than inflammation in the right lower quadrant, are listed above in the findings section.  Two additional trocars were placed, one 5 mm trocar suprapubically and one 12 mm trocar in the left lower quadrant.  These were placed under direct vision without any trauma to the underlying viscera.    The patient was then placed in head down, left side down positioning.  The appendix was identified and dissected free from its attachments to the abdominal wall, small intestine and cecum.  A window was created in the mesoappendix using blunt dissection.  We used one 60 mm white load of the endoscopic linear stapler to divide the base of the appendix from the cecum.  Then the harmonic scalpel was used to divide the mesoappendix.  The appendix was removed through the 12 mm port site in the left lower quadrant.  The operative field was dry on final inspection. The staple line was well formed.  There was good hemostasis at the end of the case.  At this point we directed our attention to closure.  The patient was moved back to a level position.  The 12 mm trocar site was closed at the fascial level using an 0-vicryl on a fascial suture passer.  The abdomen was desufflated.   The skin was closed using 4-0 Monocryl and dermabond.  All sponge and needle counts were correct at the end of the case.    Deward Foy, MD General, Bariatric, & Minimally Invasive Surgery [facility], [place]

## 2023-11-15 NOTE — Anesthesia Procedure Notes (Signed)
 Procedure Name: Intubation Date/Time: 11/15/2023 11:48 AM  Performed by: Emmitt Millman, CRNAPre-anesthesia Checklist: Patient identified, Emergency Drugs available, Suction available and Patient being monitored Patient Re-evaluated:Patient Re-evaluated prior to induction Oxygen Delivery Method: Circle system utilized Preoxygenation: Pre-oxygenation with 100% oxygen Induction Type: IV induction Ventilation: Mask ventilation without difficulty Laryngoscope Size: Mac and 3 Grade View: Grade I Tube type: Oral Tube size: 7.0 mm Number of attempts: 1 Airway Equipment and Method: Stylet Placement Confirmation: ETT inserted through vocal cords under direct vision, positive ETCO2 and breath sounds checked- equal and bilateral Secured at: 22 cm Tube secured with: Tape Dental Injury: Teeth and Oropharynx as per pre-operative assessment

## 2023-11-15 NOTE — Transfer of Care (Signed)
 Immediate Anesthesia Transfer of Care Note  Patient: Rhesa Forsberg  Procedure(s) Performed: APPENDECTOMY, LAPAROSCOPIC (Abdomen)  Patient Location: PACU  Anesthesia Type:General  Level of Consciousness: awake and alert   Airway & Oxygen Therapy: Patient Spontanous Breathing and Patient connected to nasal cannula oxygen  Post-op Assessment: Report given to RN and Post -op Vital signs reviewed and stable  Post vital signs: Reviewed and stable  Last Vitals:  Vitals Value Taken Time  BP 138/72   Temp 97.8   Pulse 122 11/15/23 12:36  Resp 19   SpO2 100 % 11/15/23 12:36  Vitals shown include unfiled device data.  Last Pain:  Vitals:   11/15/23 1049  TempSrc:   PainSc: 5          Complications: No notable events documented.

## 2023-11-15 NOTE — Anesthesia Preprocedure Evaluation (Addendum)
 Anesthesia Evaluation  Patient identified by MRN, date of birth, ID band Patient awake    Reviewed: Allergy & Precautions, H&P , NPO status , Patient's Chart, lab work & pertinent test results  Airway Mallampati: I  TM Distance: >3 FB Neck ROM: Full    Dental no notable dental hx. (+) Teeth Intact, Dental Advisory Given   Pulmonary neg pulmonary ROS   Pulmonary exam normal breath sounds clear to auscultation       Cardiovascular negative cardio ROS Normal cardiovascular exam Rhythm:Regular Rate:Normal     Neuro/Psych  PSYCHIATRIC DISORDERS Anxiety Depression    negative neurological ROS     GI/Hepatic negative GI ROS, Neg liver ROS,,,  Endo/Other  negative endocrine ROS    Renal/GU negative Renal ROS  negative genitourinary   Musculoskeletal negative musculoskeletal ROS (+)    Abdominal   Peds negative pediatric ROS (+)  Hematology negative hematology ROS (+) Hb 16   Anesthesia Other Findings   Reproductive/Obstetrics negative OB ROS Urine preg neg yesterday                               Anesthesia Physical Anesthesia Plan  ASA: 1  Anesthesia Plan: General   Post-op Pain Management: Tylenol  PO (pre-op)*, Toradol  IV (intra-op)*, Ketamine IV*, Precedex  and Dilaudid  IV   Induction: Intravenous  PONV Risk Score and Plan: 4 or greater and Ondansetron , Dexamethasone , Midazolam , Treatment may vary due to age or medical condition and Scopolamine  patch - Pre-op  Airway Management Planned: Oral ETT  Additional Equipment: None  Intra-op Plan:   Post-operative Plan: Extubation in OR  Informed Consent: I have reviewed the patients History and Physical, chart, labs and discussed the procedure including the risks, benefits and alternatives for the proposed anesthesia with the patient or authorized representative who has indicated his/her understanding and acceptance.     Dental  advisory given  Plan Discussed with: CRNA  Anesthesia Plan Comments:          Anesthesia Quick Evaluation

## 2023-11-15 NOTE — H&P (Signed)
 Admitting Physician: Deward PARAS Jaskirat Zertuche  Service: General surgery  CC: abdominal pain  Subjective   HPI: Lisa Harvey is an 23 y.o. female who is here for abdominal pain.  Located in the right lower quadrant.  Started around 6PM yesterday.  Still hurts.  No major abdominal surgeries before.  No major medical issues.  Past Medical History:  Diagnosis Date   Anxiety     Past Surgical History:  Procedure Laterality Date   TYMPANOSTOMY TUBE PLACEMENT      Family History  Problem Relation Age of Onset   Depression Mother    Depression Father    Depression Maternal Aunt    Depression Other     Social:  reports that she has never smoked. She does not have any smokeless tobacco history on file. She reports that she does not drink alcohol and does not use drugs.  Allergies:  Allergies  Allergen Reactions   Cabbage Anaphylaxis   Penicillins Hives    Reaction at 1-2 yrs old  Has patient had a PCN reaction causing immediate rash, facial/tongue/throat swelling, SOB or lightheadedness with hypotension: Yes Has patient had a PCN reaction causing severe rash involving mucus membranes or skin necrosis: No Has patient had a PCN reaction that required hospitalization No Has patient had a PCN reaction occurring within the last 10 years: No If all of the above answers are NO, then may proceed with Cephalosporin use.    Medications: No current outpatient medications  ROS - all of the below systems have been reviewed with the patient and positives are indicated with bold text General: chills, fever or night sweats Eyes: blurry vision or double vision ENT: epistaxis or sore throat Allergy/Immunology: itchy/watery eyes or nasal congestion Hematologic/Lymphatic: bleeding problems, blood clots or swollen lymph nodes Endocrine: temperature intolerance or unexpected weight changes Breast: new or changing breast lumps or nipple discharge Resp: cough, shortness of breath, or  wheezing CV: chest pain or dyspnea on exertion GI: as per HPI GU: dysuria, trouble voiding, or hematuria MSK: joint pain or joint stiffness Neuro: TIA or stroke symptoms Derm: pruritus and skin lesion changes Psych: anxiety and depression  Objective   PE Blood pressure 106/65, pulse 62, temperature 98.4 F (36.9 C), resp. rate 16, height 5' 4 (1.626 m), weight 49.9 kg, last menstrual period 11/11/2023, SpO2 98%. Constitutional: NAD; conversant; no deformities Eyes: Moist conjunctiva; no lid lag; anicteric; PERRL Neck: Trachea midline; no thyromegaly Lungs: Normal respiratory effort; no tactile fremitus CV: RRR; no palpable thrills; no pitting edema GI: Abd Tender RLQ; no palpable hepatosplenomegaly MSK: Normal range of motion of extremities; no clubbing/cyanosis Psychiatric: Appropriate affect; alert and oriented x3 Lymphatic: No palpable cervical or axillary lymphadenopathy  Results for orders placed or performed during the hospital encounter of 11/14/23 (from the past 24 hours)  Lipase, blood     Status: None   Collection Time: 11/14/23  8:23 PM  Result Value Ref Range   Lipase 31 11 - 51 U/L  Comprehensive metabolic panel     Status: Abnormal   Collection Time: 11/14/23  8:23 PM  Result Value Ref Range   Sodium 138 135 - 145 mmol/L   Potassium 3.8 3.5 - 5.1 mmol/L   Chloride 106 98 - 111 mmol/L   CO2 19 (L) 22 - 32 mmol/L   Glucose, Bld 103 (H) 70 - 99 mg/dL   BUN 6 6 - 20 mg/dL   Creatinine, Ser 9.16 0.44 - 1.00 mg/dL   Calcium 9.8 8.9 -  10.3 mg/dL   Total Protein 7.4 6.5 - 8.1 g/dL   Albumin 4.6 3.5 - 5.0 g/dL   AST 23 15 - 41 U/L   ALT 16 0 - 44 U/L   Alkaline Phosphatase 63 38 - 126 U/L   Total Bilirubin 1.0 0.0 - 1.2 mg/dL   GFR, Estimated >39 >39 mL/min   Anion gap 13 5 - 15  CBC     Status: Abnormal   Collection Time: 11/14/23  8:23 PM  Result Value Ref Range   WBC 16.5 (H) 4.0 - 10.5 K/uL   RBC 5.24 (H) 3.87 - 5.11 MIL/uL   Hemoglobin 16.0 (H) 12.0 -  15.0 g/dL   HCT 52.3 (H) 63.9 - 53.9 %   MCV 90.8 80.0 - 100.0 fL   MCH 30.5 26.0 - 34.0 pg   MCHC 33.6 30.0 - 36.0 g/dL   RDW 86.7 88.4 - 84.4 %   Platelets 365 150 - 400 K/uL   nRBC 0.0 0.0 - 0.2 %  hCG, serum, qualitative     Status: None   Collection Time: 11/14/23  8:23 PM  Result Value Ref Range   Preg, Serum NEGATIVE NEGATIVE  POC Urine Pregnancy, ED (not at [facility] or [facility])     Status: None   Collection Time: 11/14/23  8:25 PM  Result Value Ref Range   Preg Test, Ur NEGATIVE NEGATIVE  Urinalysis, Routine w reflex microscopic -Urine, Clean Catch     Status: Abnormal   Collection Time: 11/14/23  8:32 PM  Result Value Ref Range   Color, Urine COLORLESS (A) YELLOW   APPearance CLEAR CLEAR   Specific Gravity, Urine 1.003 (L) 1.005 - 1.030   pH 7.0 5.0 - 8.0   Glucose, UA NEGATIVE NEGATIVE mg/dL   Hgb urine dipstick NEGATIVE NEGATIVE   Bilirubin Urine NEGATIVE NEGATIVE   Ketones, ur NEGATIVE NEGATIVE mg/dL   Protein, ur NEGATIVE NEGATIVE mg/dL   Nitrite NEGATIVE NEGATIVE   Leukocytes,Ua NEGATIVE NEGATIVE     Imaging Orders         US  PELVIC COMPLETE W TRANSVAGINAL AND TORSION R/O         CT ABDOMEN PELVIS W CONTRAST     Changes consistent with early appendicitis. No evidence of perforation is noted.  Assessment and Plan   Lisa Harvey is an 23 y.o. female with abdominal pain, found on CT to have appendicitis.  I recommended laparoscopic appendectomy.  We discussed antibiotic treatment as an alternative option.  We discussed the surgery itself, as well as its risks, benefits and alternatives and the patient granted consent to proceed.  Will proceed to OR once time is available.   Deward JINNY Foy, MD  [facility] Use [email or web address] to contact on call provider  New Patient Billing: [phone] - High MDM

## 2023-11-15 NOTE — Discharge Instructions (Signed)
LAPAROSCOPIC SURGERY: POST OP INSTRUCTIONS Always review your discharge instruction sheet given to you by the facility where your surgery was performed. IF YOU HAVE DISABILITY OR FAMILY LEAVE FORMS, YOU MUST BRING THEM TO THE OFFICE FOR PROCESSING.   DO NOT GIVE THEM TO YOUR DOCTOR.  PAIN CONTROL  First take acetaminophen (Tylenol) AND/or ibuprofen (Advil) to control your pain after surgery.  Follow directions on package.  Taking acetaminophen (Tylenol) and/or ibuprofen (Advil) regularly after surgery will help to control your pain and lower the amount of prescription pain medication you may need.  You should not take more than 3,000 mg (3 grams) of acetaminophen (Tylenol) in 24 hours.  You should not take ibuprofen (Advil), aleve, motrin, naprosyn or other NSAIDS if you have a history of stomach ulcers or chronic kidney disease.  A prescription for pain medication may be given to you upon discharge.  Take your pain medication as prescribed, if you still have uncontrolled pain after taking acetaminophen (Tylenol) or ibuprofen (Advil). Use ice packs to help control pain. If you need a refill on your pain medication, please contact your pharmacy.  They will contact our office to request authorization. Prescriptions will not be filled after 5pm or on week-ends.  HOME MEDICATIONS Take your usually prescribed medications unless otherwise directed.  DIET You should follow a light diet the first few days after arrival home.  Be sure to include lots of fluids daily. Avoid fatty, fried foods.   CONSTIPATION It is common to experience some constipation after surgery and if you are taking pain medication.  Increasing fluid intake and taking a stool softener (such as Colace) will usually help or prevent this problem from occurring.  A mild laxative (Milk of Magnesia or Miralax) should be taken according to package instructions if there are no bowel movements after 48 hours.  WOUND/INCISION CARE Most  patients will experience some swelling and bruising in the area of the incisions.  Ice packs will help.  Swelling and bruising can take several days to resolve.  Unless discharge instructions indicate otherwise, follow guidelines below  STERI-STRIPS - you may remove your outer bandages 48 hours after surgery, and you may shower at that time.  You have steri-strips (small skin tapes) in place directly over the incision.  These strips should be left on the skin for 7-10 days.   DERMABOND/SKIN GLUE - you may shower in 24 hours.  The glue will flake off over the next 2-3 weeks. Any sutures or staples will be removed at the office during your follow-up visit.  ACTIVITIES You may resume regular (light) daily activities beginning the next day--such as daily self-care, walking, climbing stairs--gradually increasing activities as tolerated.  You may have sexual intercourse when it is comfortable.  Refrain from any heavy lifting or straining until approved by your doctor. You may drive when you are no longer taking prescription pain medication, you can comfortably wear a seatbelt, and you can safely maneuver your car and apply brakes.  FOLLOW-UP You should see your doctor in the office for a follow-up appointment approximately 2-3 weeks after your surgery.  You should have been given your post-op/follow-up appointment when your surgery was scheduled.  If you did not receive a post-op/follow-up appointment, make sure that you call for this appointment within a day or two after you arrive home to insure a convenient appointment time.   WHEN TO CALL YOUR DOCTOR: Fever over 101.0 Inability to urinate Continued bleeding from incision. Increased pain, redness, or drainage   from the incision. Increasing abdominal pain  The clinic staff is available to answer your questions during regular business hours.  Please don't hesitate to call and ask to speak to one of the nurses for clinical concerns.  If you have a  medical emergency, go to the nearest emergency room or call 911.  A surgeon from [facility] is always on call at the hospital. [address], [address], [place], [place]  [address] ? [address] [address], [place], [place]   [address] [phone] ? [phone] ? FAX [phone] Web site: [email or web address]      Managing Your Pain After Surgery Without Opioids    Thank you for participating in our program to help patients manage their pain after surgery without opioids. This is part of our effort to provide you with the best care possible, without exposing you or your family to the risk that opioids pose.  What pain can I expect after surgery? You can expect to have some pain after surgery. This is normal. The pain is typically worse the day after surgery, and quickly begins to get better. Many studies have found that many patients are able to manage their pain after surgery with Over-the-Counter (OTC) medications such as Tylenol and Motrin. If you have a condition that does not allow you to take Tylenol or Motrin, notify your surgical team.  How will I manage my pain? The best strategy for controlling your pain after surgery is around the clock pain control with Tylenol (acetaminophen) and Motrin (ibuprofen or Advil). Alternating these medications with each other allows you to maximize your pain control. In addition to Tylenol and Motrin, you can use heating pads or ice packs on your incisions to help reduce your pain.  How will I alternate your regular strength over-the-counter pain medication? You will take a dose of pain medication every three hours. Start by taking 650 mg of Tylenol (2 pills of 325 mg) 3 hours later take 600 mg of Motrin (3 pills of 200 mg) 3 hours after taking the Motrin take 650 mg of Tylenol 3 hours after that take 600 mg of Motrin.   - 1 -  See example - if your first dose of Tylenol is at 12:00 PM   12:00 PM Tylenol 650 mg (2  pills of 325 mg)  3:00 PM Motrin 600 mg (3 pills of 200 mg)  6:00 PM Tylenol 650 mg (2 pills of 325 mg)  9:00 PM Motrin 600 mg (3 pills of 200 mg)  Continue alternating every 3 hours   We recommend that you follow this schedule around-the-clock for at least 3 days after surgery, or until you feel that it is no longer needed. Use the table on the last page of this handout to keep track of the medications you are taking. Important: Do not take more than 3000mg of Tylenol or 3200mg of Motrin in a 24-hour period. Do not take ibuprofen/Motrin if you have a history of bleeding stomach ulcers, severe kidney disease, &/or actively taking a blood thinner  What if I still have pain? If you have pain that is not controlled with the over-the-counter pain medications (Tylenol and Motrin or Advil) you might have what we call "breakthrough" pain. You will receive a prescription for a small amount of an opioid pain medication such as Oxycodone, Tramadol, or Tylenol with Codeine. Use these opioid pills in the first 24 hours after surgery if you have breakthrough pain. Do not take more than 1   pill every 4-6 hours.  If you still have uncontrolled pain after using all opioid pills, don't hesitate to call our staff using the number provided. We will help make sure you are managing your pain in the best way possible, and if necessary, we can provide a prescription for additional pain medication.   Day 1    Time  Name of Medication Number of pills taken  Amount of Acetaminophen  Pain Level   Comments  AM PM       AM PM       AM PM       AM PM       AM PM       AM PM       AM PM       AM PM       Total Daily amount of Acetaminophen Do not take more than  3,000 mg per day      Day 2    Time  Name of Medication Number of pills taken  Amount of Acetaminophen  Pain Level   Comments  AM PM       AM PM       AM PM       AM PM       AM PM       AM PM       AM PM       AM PM       Total Daily  amount of Acetaminophen Do not take more than  3,000 mg per day      Day 3    Time  Name of Medication Number of pills taken  Amount of Acetaminophen  Pain Level   Comments  AM PM       AM PM       AM PM       AM PM         AM PM       AM PM       AM PM       AM PM       Total Daily amount of Acetaminophen Do not take more than  3,000 mg per day      Day 4    Time  Name of Medication Number of pills taken  Amount of Acetaminophen  Pain Level   Comments  AM PM       AM PM       AM PM       AM PM       AM PM       AM PM       AM PM       AM PM       Total Daily amount of Acetaminophen Do not take more than  3,000 mg per day      Day 5    Time  Name of Medication Number of pills taken  Amount of Acetaminophen  Pain Level   Comments  AM PM       AM PM       AM PM       AM PM       AM PM       AM PM       AM PM       AM PM       Total Daily amount of Acetaminophen Do not take more than  3,000 mg per day        Day 6    Time  Name of Medication Number of pills taken  Amount of Acetaminophen  Pain Level  Comments  AM PM       AM PM       AM PM       AM PM       AM PM       AM PM       AM PM       AM PM       Total Daily amount of Acetaminophen Do not take more than  3,000 mg per day      Day 7    Time  Name of Medication Number of pills taken  Amount of Acetaminophen  Pain Level   Comments  AM PM       AM PM       AM PM       AM PM       AM PM       AM PM       AM PM       AM PM       Total Daily amount of Acetaminophen Do not take more than  3,000 mg per day        For additional information about how and where to safely dispose of unused opioid medications - https://www.[facility]powerful[employer].org  Disclaimer: This document contains information and/or instructional materials adapted from [facility] for the typical patient with your condition. It does not replace medical advice from your health care provider because  your experience may differ from that of the typical patient. Talk to your health care provider if you have any questions about this document, your condition or your treatment plan. Adapted from [facility]  

## 2023-11-15 NOTE — Discharge Summary (Signed)
  Patient ID: Lisa Harvey [id number] 22 y.o. [birth year]  11/14/2023  Discharge date and time: 11/15/2023  Admitting Physician: Lisa Harvey Lisa Harvey  Discharge Physician: Lisa Harvey Lisa Harvey  Admission Diagnoses: Acute appendicitis [K35.80] Other acute appendicitis [K35.890] Patient Active Problem List   Diagnosis Date Noted   Acute appendicitis 11/15/2023   Attention deficit hyperactivity disorder (ADHD) 09/01/2015   MDD (major depressive disorder) 08/30/2015     Discharge Diagnoses:  Patient Active Problem List   Diagnosis Date Noted   Acute appendicitis 11/15/2023   Attention deficit hyperactivity disorder (ADHD) 09/01/2015   MDD (major depressive disorder) 08/30/2015    Operations: Procedure(s): APPENDECTOMY, LAPAROSCOPIC  Admission Condition: good  Discharged Condition: good  Indication for Admission: Appendicitis  Hospital Course: Laparoscopic appendectomy  Consults: None  Significant Diagnostic Studies: CT  Treatments: surgery: as above  Disposition: Home  Patient Instructions:  Allergies as of 11/15/2023       Reactions   Cabbage Anaphylaxis   Penicillins Hives   Reaction at 1-2 yrs old  Has patient had a PCN reaction causing immediate rash, facial/tongue/throat swelling, SOB or lightheadedness with hypotension: Yes Has patient had a PCN reaction causing severe rash involving mucus membranes or skin necrosis: No Has patient had a PCN reaction that required hospitalization No Has patient had a PCN reaction occurring within the last 10 years: No If all of the above answers are NO, then may proceed with Cephalosporin use.        Medication List     TAKE these medications    ibuprofen  800 MG tablet Commonly known as: ADVIL  Take 1 tablet (800 mg total) by mouth every 8 (eight) hours as needed.   oxyCODONE  5 MG immediate release tablet Commonly known as: Oxy IR/ROXICODONE  Take 1 tablet (5 mg total) by mouth every 6 (six) hours as needed  for severe pain (pain score 7-10).        Activity: no heavy lifting for 4 weeks Diet: regular diet Wound Care: keep wound clean and dry  Follow-up:  With Dr. Lyndel  Signed: Deward Harvey Lisa Harvey [facility], Bariat[facility]imally Invasive [facility], [place]   11/15/2023, 12:25 PM

## 2023-11-15 NOTE — ED Provider Notes (Addendum)
 Results for orders placed or performed during the hospital encounter of 11/14/23  Lactic acid, plasma   Collection Time: 11/14/23 12:02 AM  Result Value Ref Range   Lactic Acid, Venous 1.1 0.5 - 1.9 mmol/L  Lipase, blood   Collection Time: 11/14/23  8:23 PM  Result Value Ref Range   Lipase 31 11 - 51 U/L  Comprehensive metabolic panel   Collection Time: 11/14/23  8:23 PM  Result Value Ref Range   Sodium 138 135 - 145 mmol/L   Potassium 3.8 3.5 - 5.1 mmol/L   Chloride 106 98 - 111 mmol/L   CO2 19 (L) 22 - 32 mmol/L   Glucose, Bld 103 (H) 70 - 99 mg/dL   BUN 6 6 - 20 mg/dL   Creatinine, Ser 9.16 0.44 - 1.00 mg/dL   Calcium 9.8 8.9 - 89.6 mg/dL   Total Protein 7.4 6.5 - 8.1 g/dL   Albumin 4.6 3.5 - 5.0 g/dL   AST 23 15 - 41 U/L   ALT 16 0 - 44 U/L   Alkaline Phosphatase 63 38 - 126 U/L   Total Bilirubin 1.0 0.0 - 1.2 mg/dL   GFR, Estimated >39 >39 mL/min   Anion gap 13 5 - 15  CBC   Collection Time: 11/14/23  8:23 PM  Result Value Ref Range   WBC 16.5 (H) 4.0 - 10.5 K/uL   RBC 5.24 (H) 3.87 - 5.11 MIL/uL   Hemoglobin 16.0 (H) 12.0 - 15.0 g/dL   HCT 52.3 (H) 63.9 - 53.9 %   MCV 90.8 80.0 - 100.0 fL   MCH 30.5 26.0 - 34.0 pg   MCHC 33.6 30.0 - 36.0 g/dL   RDW 86.7 88.4 - 84.4 %   Platelets 365 150 - 400 K/uL   nRBC 0.0 0.0 - 0.2 %  hCG, serum, qualitative   Collection Time: 11/14/23  8:23 PM  Result Value Ref Range   Preg, Serum NEGATIVE NEGATIVE  POC Urine Pregnancy, ED (not at [facility] or [facility])   Collection Time: 11/14/23  8:25 PM  Result Value Ref Range   Preg Test, Ur NEGATIVE NEGATIVE  Urinalysis, Routine w reflex microscopic -Urine, Clean Catch   Collection Time: 11/14/23  8:32 PM  Result Value Ref Range   Color, Urine COLORLESS (A) YELLOW   APPearance CLEAR CLEAR   Specific Gravity, Urine 1.003 (L) 1.005 - 1.030   pH 7.0 5.0 - 8.0   Glucose, UA NEGATIVE NEGATIVE mg/dL   Hgb urine dipstick NEGATIVE NEGATIVE   Bilirubin Urine NEGATIVE NEGATIVE   Ketones, ur  NEGATIVE NEGATIVE mg/dL   Protein, ur NEGATIVE NEGATIVE mg/dL   Nitrite NEGATIVE NEGATIVE   Leukocytes,Ua NEGATIVE NEGATIVE   CT ABDOMEN PELVIS W CONTRAST Result Date: 11/15/2023 CLINICAL DATA:  Right lower quadrant pain EXAM: CT ABDOMEN AND PELVIS WITH CONTRAST TECHNIQUE: Multidetector CT imaging of the abdomen and pelvis was performed using the standard protocol following bolus administration of intravenous contrast. RADIATION DOSE REDUCTION: This exam was performed according to the departmental dose-optimization program which includes automated exposure control, adjustment of the mA and/or kV according to patient size and/or use of iterative reconstruction technique. CONTRAST:  75mL OMNIPAQUE  IOHEXOL  350 MG/ML SOLN COMPARISON:  Ultrasound of the pelvis from earlier in the same day. FINDINGS: Lower chest: No acute abnormality. Hepatobiliary: No focal liver abnormality is seen. No gallstones, gallbladder wall thickening, or biliary dilatation. Pancreas: Unremarkable. No pancreatic ductal dilatation or surrounding inflammatory changes. Spleen: Normal in size without focal abnormality. Adrenals/Urinary Tract:  Adrenal glands are within normal limits. Kidneys demonstrate a normal enhancement pattern bilaterally. No renal calculi or obstructive changes are seen. The bladder is partially distended. Stomach/Bowel: Scattered fecal material is noted throughout the colon. No obstructive or inflammatory changes of the colon are seen. The appendix is mildly prominent measuring 8 mm in diameter. Surrounding early inflammatory changes are seen without evidence of perforation. These findings are consistent with early appendicitis. The small bowel and stomach are unremarkable. Vascular/Lymphatic: No significant vascular findings are present. No enlarged abdominal or pelvic lymph nodes. Reproductive: Uterus is within normal limits. Stable left ovarian cyst is seen. No right adnexal mass is noted. Other: Minimal free fluid is  noted within the pelvis. Musculoskeletal: No acute or significant osseous findings. IMPRESSION: Changes consistent with early appendicitis. No evidence of perforation is noted. Electronically Signed   By: Oneil Devonshire M.D.   On: 11/15/2023 00:39   US  PELVIC COMPLETE W TRANSVAGINAL AND TORSION R/O Result Date: 11/14/2023 CLINICAL DATA:  Right lower quadrant pain EXAM: TRANSABDOMINAL AND TRANSVAGINAL ULTRASOUND OF PELVIS TECHNIQUE: Both transabdominal and transvaginal ultrasound examinations of the pelvis were performed. Transabdominal technique was performed for global imaging of the pelvis including uterus, ovaries, adnexal regions, and pelvic cul-de-sac. It was necessary to proceed with endovaginal exam following the transabdominal exam to visualize the ovaries and endometrium. COMPARISON:  CT abdomen and pelvis 08/09/2021 FINDINGS: Uterus Measurements: 7.2 x 3.0 x 3.8 cm = volume: 43 mL. No fibroids or other mass visualized. Endometrium Thickness: 3.1 mm.  No focal abnormality visualized. Right ovary Not visualized. Left ovary Measurements: 2.9 x 2.1 x 2.6 cm = volume: 8.4 mL. There is a 2.2 x 1.6 x 2.1 cm simple cyst in the left ovary. Other findings No abnormal free fluid. IMPRESSION: 1. 2.2 cm simple cyst in the left ovary. No follow-up imaging recommended. 2. Right ovary is not visualized. 3. No other significant abnormality. Electronically Signed   By: Greig Pique M.D.   On: 11/14/2023 22:54    CT with findings of appendicitis. Starting IV abx, will discuss with general surgery.  Spoke with on call surgeon, Dr. Tanda-- will eval and admit.  CRITICAL CARE Performed by: Olam CHRISTELLA [place]   Total critical care time: 35 minutes  Critical care time was exclusive of separately billable procedures and treating other patients.  Critical care was necessary to treat or prevent imminent or life-threatening deterioration.  Critical care was time spent personally by me on the following activities:  development of treatment plan with patient and/or surrogate as well as nursing, discussions with consultants, evaluation of patient's response to treatment, examination of patient, obtaining history from patient or surrogate, ordering and performing treatments and interventions, ordering and review of laboratory studies, ordering and review of radiographic studies, pulse oximetry and re-evaluation of patient's condition.    [place] Olam CHRISTELLA, PA-C 11/15/23 0306    [place] Olam CHRISTELLA, PA-C 11/15/23 [id number]    Carita Senior, MD 11/15/23 [phone]

## 2023-11-15 NOTE — Anesthesia Postprocedure Evaluation (Signed)
 Anesthesia Post Note  Patient: Lisa Harvey  Procedure(s) Performed: APPENDECTOMY, LAPAROSCOPIC (Abdomen)     Patient location during evaluation: PACU Anesthesia Type: General Level of consciousness: sedated and patient cooperative Pain management: pain level controlled Vital Signs Assessment: post-procedure vital signs reviewed and stable Respiratory status: spontaneous breathing Cardiovascular status: stable Anesthetic complications: no   No notable events documented.  Last Vitals:  Vitals:   11/15/23 1330 11/15/23 1345  BP: 97/64 96/61  Pulse: 81 78  Resp: 13 12  Temp:  36.7 C  SpO2: 93% 96%    Last Pain:  Vitals:   11/15/23 1345  TempSrc:   PainSc: Asleep                 Norleen Pope

## 2023-11-16 ENCOUNTER — Encounter (HOSPITAL_COMMUNITY): Payer: Self-pay | Admitting: Surgery

## 2023-11-16 NOTE — Addendum Note (Signed)
 Addendum  created 11/16/23 1202 by Merla Almarie HERO, DO   Attestation recorded in [place], Intraprocedure Attestations filed

## 2023-11-17 LAB — SURGICAL PATHOLOGY
